# Patient Record
Sex: Male | Born: 1995 | Race: Black or African American | Hispanic: No | Marital: Single | Smoking: Never smoker
Health system: Southern US, Community
[De-identification: ages and names within clinical notes are randomized; demographics above are authoritative.]

---
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
## 2016-05-31 IMAGING — CT CT CERVICAL SPINE W/O CM
3 series · 12 of 33 positions shown, 14 images · non-contrast
Comparison: None.

CLINICAL DATA: Fell playing basketball, head injury with known
skullbase fractures and rhinorrhea. Neck pain.

EXAM:
CT CERVICAL SPINE WITHOUT CONTRAST
TECHNIQUE: Multidetector CT imaging of the cervical spine was performed without
intravenous contrast. Multiplanar CT image reconstructions were also
generated.

[Series 205: coronals · coronal · 0.39mm/px · 3 of 37 slices shown]
[im 8/37  bone]
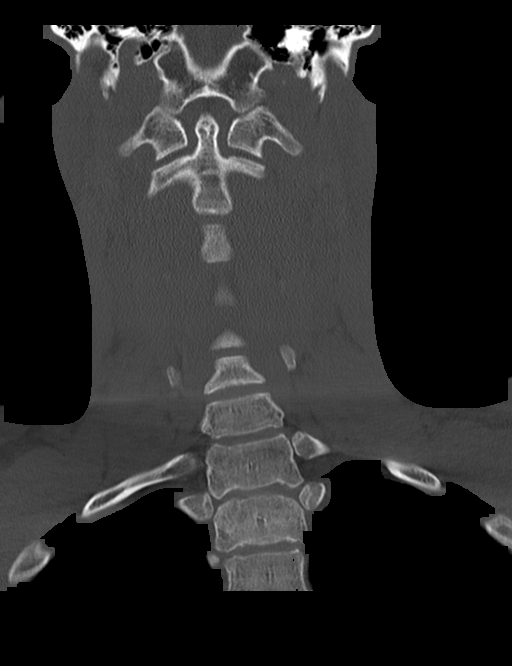
[im 15/37  bone]
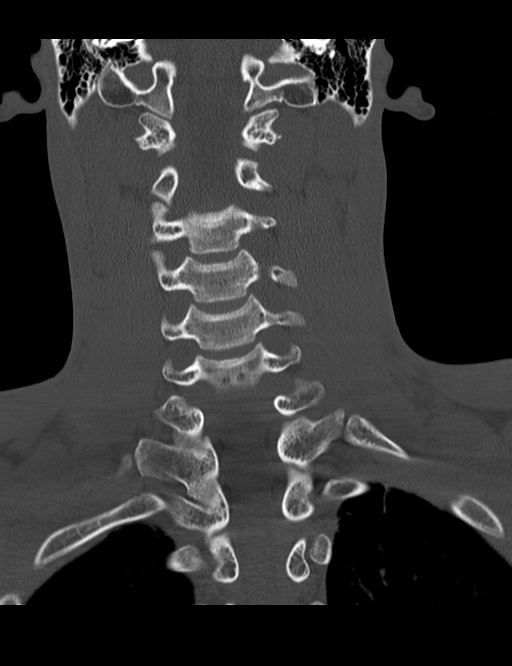
[im 22/37  bone]
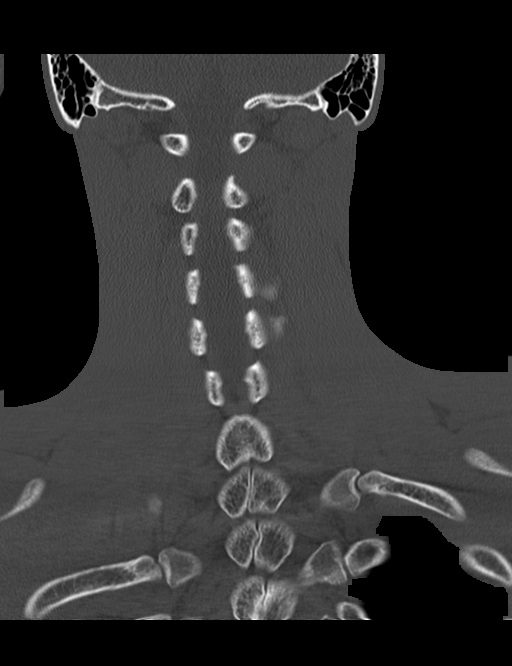

[Series 206: sagittals · sagittal · 0.39mm/px · 5 of 48 slices shown, 6 images]
[im 16/48  bone]
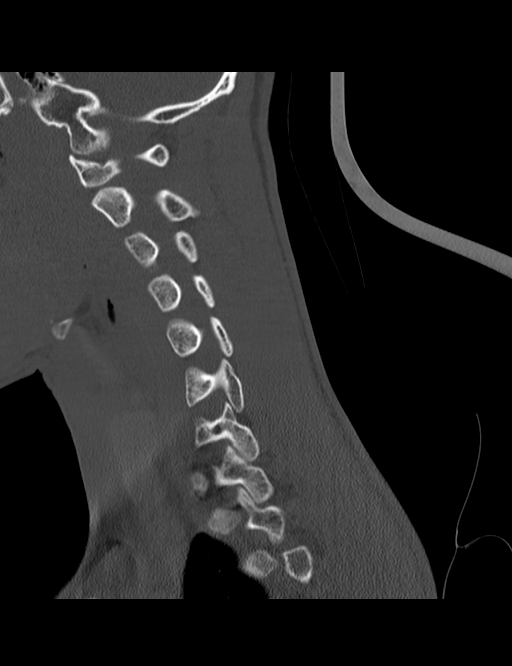
[im 20/48  bone]
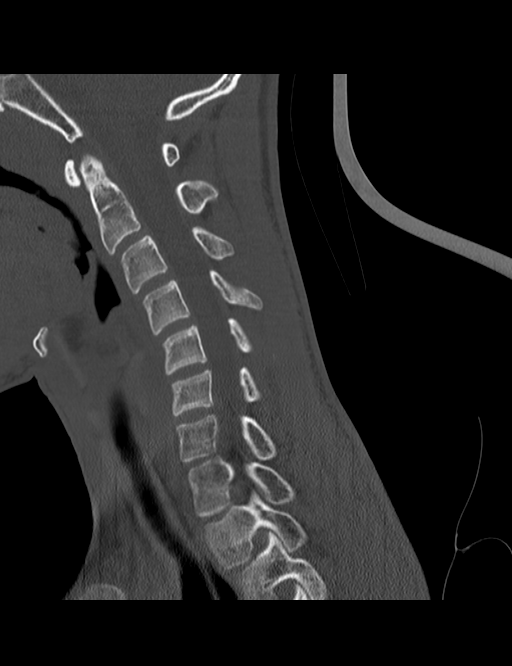
[im 24/48  soft-tissue]
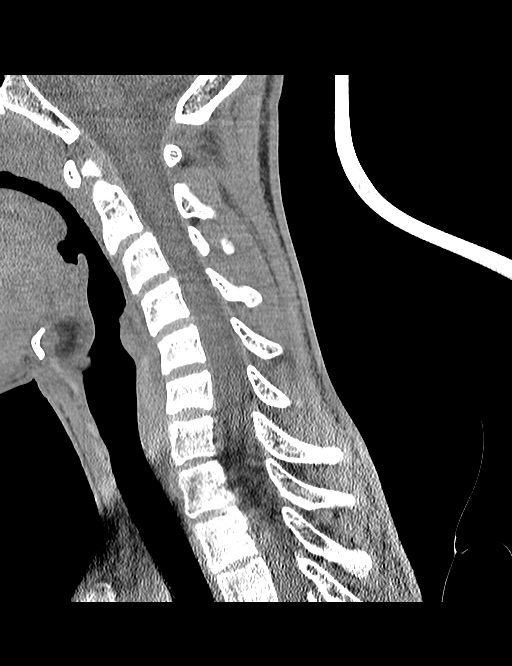
[im 24/48  bone]
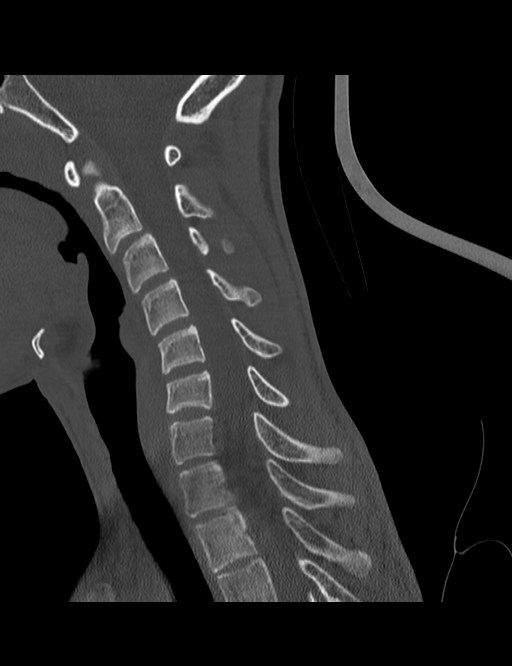
[im 28/48  bone]
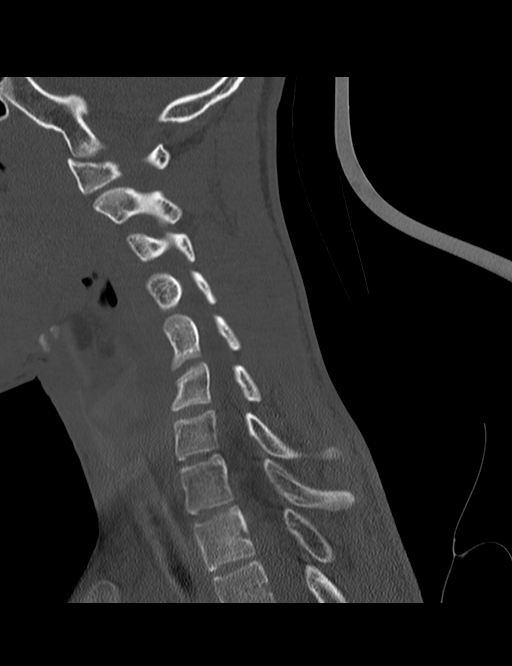
[im 32/48  bone]
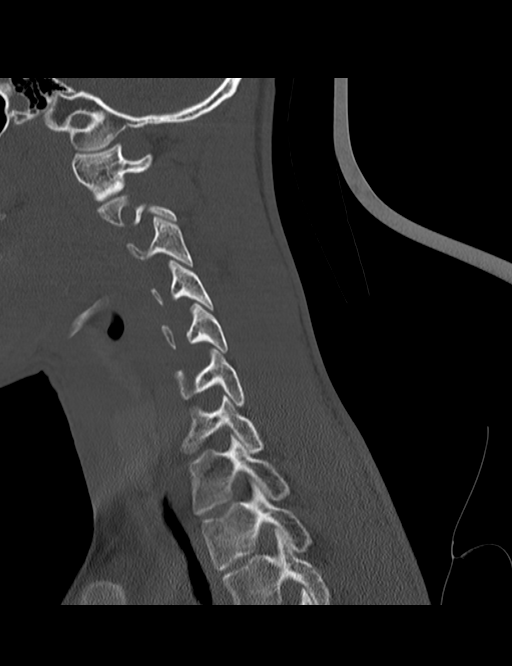

[Series 209: orthogonals · axial · 0.39mm/px · z∈[+90,+196]mm · 4 of 89 slices shown, 5 images]
[im 14/89  soft-tissue]
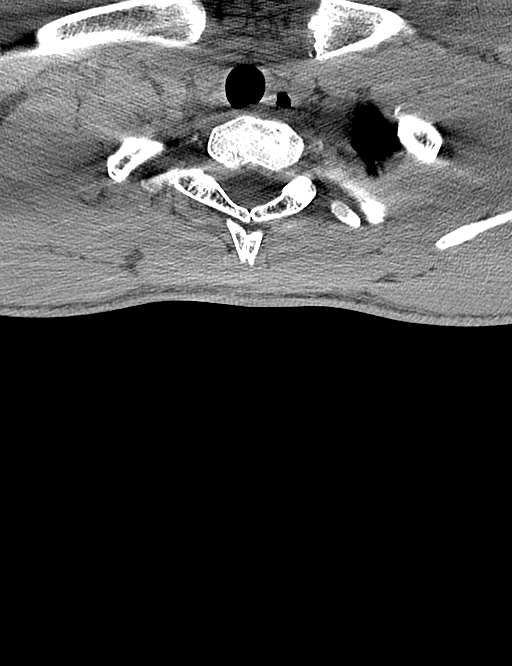
[im 14/89  bone]
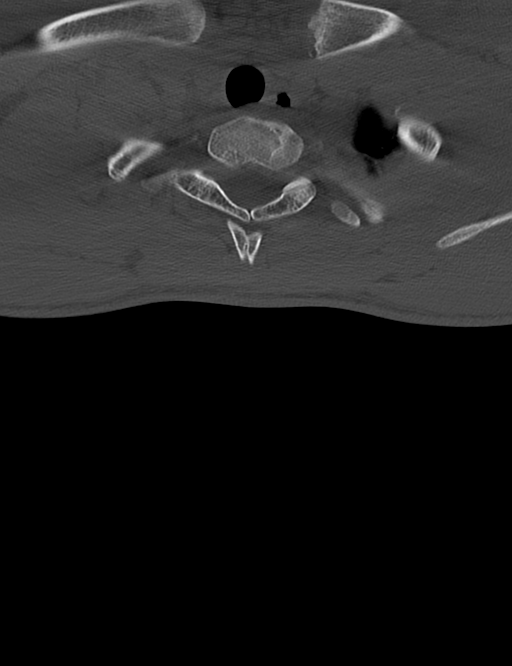
[im 34/89  bone]
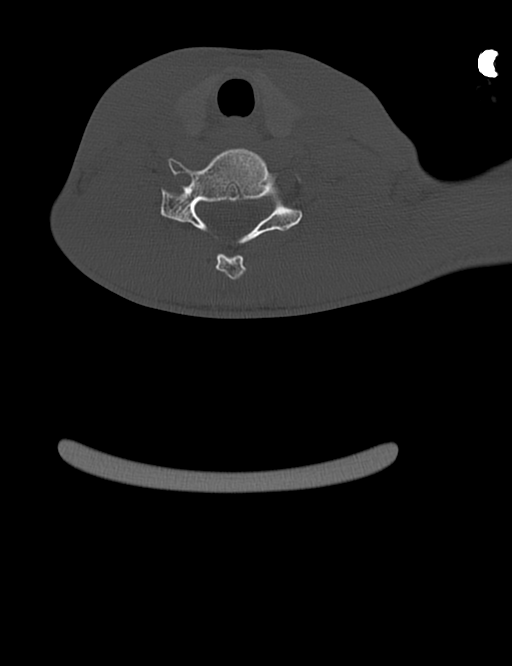
[im 55/89  bone]
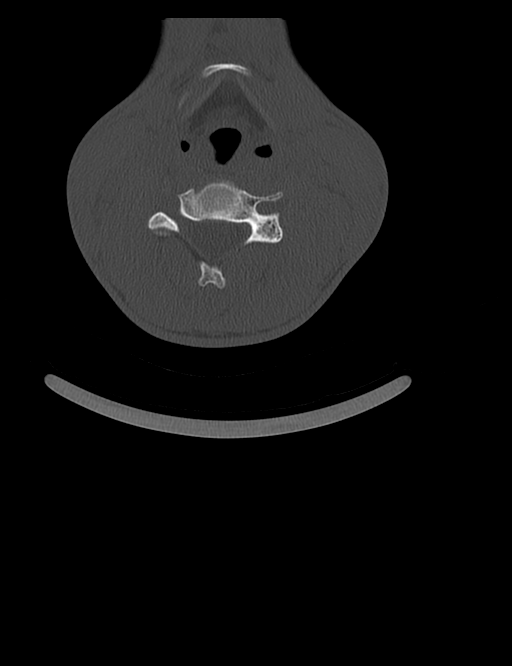
[im 75/89  bone]
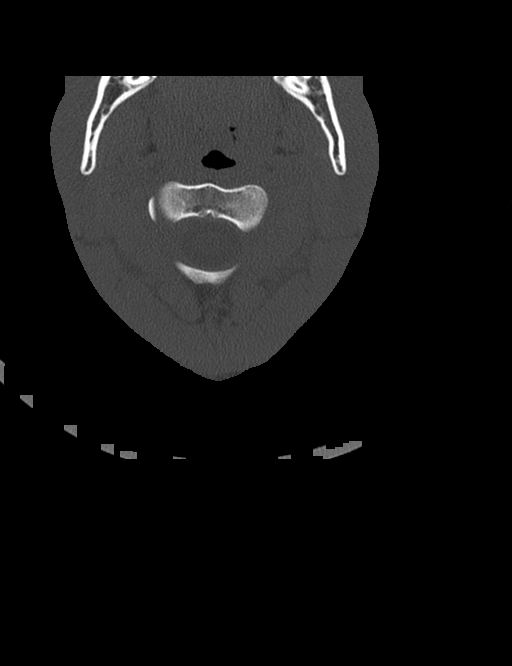

[12 of 33 positions shown; findings below may reference images not displayed]

FINDINGS: Cervical vertebral bodies and posterior elements are intact and
aligned with broad reversed cervical lordosis. Intervertebral disc
heights preserved. No destructive bony lesions. C1-2 articulation
maintained. Included prevertebral and paraspinal soft tissues are
normal; fullness of the adenoidal soft tissues in keeping with
patient's provided young age.

Spinous processes of the upper thoracic spine are congenitally
unfused.
IMPRESSION: Broad reversed cervical lordosis without acute fracture nor
malalignment.

By: Ubeyd Alosh
